# Patient Record
Sex: Female | Born: 1945 | Hispanic: No | Marital: Married | Smoking: Never smoker
Health system: Southern US, Community
[De-identification: ages and names within clinical notes are randomized; demographics above are authoritative.]

## PROBLEM LIST (undated history)

## (undated) DIAGNOSIS — F419 Anxiety disorder, unspecified: Secondary | ICD-10-CM

## (undated) DIAGNOSIS — M1990 Unspecified osteoarthritis, unspecified site: Secondary | ICD-10-CM

## (undated) DIAGNOSIS — G20 Parkinson's disease: Secondary | ICD-10-CM

## (undated) DIAGNOSIS — G20A1 Parkinson's disease without dyskinesia, without mention of fluctuations: Secondary | ICD-10-CM

## (undated) HISTORY — PX: EYE SURGERY: SHX253

## (undated) HISTORY — PX: TONSILLECTOMY: SUR1361

## (undated) HISTORY — PX: TUBAL LIGATION: SHX77

## (undated) HISTORY — PX: BREAST SURGERY: SHX581

---

## 2016-06-29 ENCOUNTER — Other Ambulatory Visit: Payer: Self-pay | Admitting: Neurological Surgery

## 2016-07-04 ENCOUNTER — Encounter (HOSPITAL_COMMUNITY): Payer: Self-pay | Admitting: *Deleted

## 2016-07-04 ENCOUNTER — Encounter (HOSPITAL_COMMUNITY)
Admission: RE | Admit: 2016-07-04 | Discharge: 2016-07-04 | Disposition: A | Discharge status: Home / Self Care | Payer: Medicare Other | Source: Ambulatory Visit | Attending: Neurological Surgery | Admitting: Neurological Surgery

## 2016-07-04 DIAGNOSIS — M48061 Spinal stenosis, lumbar region without neurogenic claudication: Secondary | ICD-10-CM | POA: Diagnosis not present

## 2016-07-04 DIAGNOSIS — G20 Parkinson's disease: Secondary | ICD-10-CM | POA: Diagnosis not present

## 2016-07-04 DIAGNOSIS — F419 Anxiety disorder, unspecified: Secondary | ICD-10-CM | POA: Diagnosis not present

## 2016-07-04 DIAGNOSIS — M4727 Other spondylosis with radiculopathy, lumbosacral region: Secondary | ICD-10-CM | POA: Diagnosis present

## 2016-07-04 HISTORY — DX: Unspecified osteoarthritis, unspecified site: M19.90

## 2016-07-04 HISTORY — DX: Anxiety disorder, unspecified: F41.9

## 2016-07-04 HISTORY — DX: Parkinson's disease: G20

## 2016-07-04 HISTORY — DX: Parkinson's disease without dyskinesia, without mention of fluctuations: G20.A1

## 2016-07-04 LAB — CBC
HEMATOCRIT: 34.8 % — AB (ref 36.0–46.0)
Hemoglobin: 11.3 g/dL — ABNORMAL LOW (ref 12.0–15.0)
MCH: 29 pg (ref 26.0–34.0)
MCHC: 32.5 g/dL (ref 30.0–36.0)
MCV: 89.5 fL (ref 78.0–100.0)
PLATELETS: 271 10*3/uL (ref 150–400)
RBC: 3.89 MIL/uL (ref 3.87–5.11)
RDW: 12.4 % (ref 11.5–15.5)
WBC: 8.6 10*3/uL (ref 4.0–10.5)

## 2016-07-04 LAB — BASIC METABOLIC PANEL
Anion gap: 10 (ref 5–15)
BUN: 6 mg/dL (ref 6–20)
CHLORIDE: 103 mmol/L (ref 101–111)
CO2: 27 mmol/L (ref 22–32)
CREATININE: 0.55 mg/dL (ref 0.44–1.00)
Calcium: 8.9 mg/dL (ref 8.9–10.3)
GFR calc non Af Amer: >60 mL/min (ref >60)
Glucose, Bld: 95 mg/dL (ref 65–99)
POTASSIUM: 3.6 mmol/L (ref 3.5–5.1)
Sodium: 140 mmol/L (ref 135–145)

## 2016-07-04 LAB — SURGICAL PCR SCREEN
MRSA, PCR: NEGATIVE
Staphylococcus aureus: NEGATIVE

## 2016-07-04 NOTE — Pre-Procedure Instructions (Signed)
Kimberly Hodge  07/04/2016      [id number] - [place], [place] - [id number] [facility] [id number] Phone: [phone] Fax: [phone]    Your procedure is scheduled on Nov 1.  [facility] at 630 A.M.  Call this number if you have problems the morning of surgery:  [phone]   Remember:  Do not eat food or drink liquids after midnight.  Take these medicines the morning of surgery with A SIP OF WATER Carbidopa-Levodopa (Sinemet IR), Citalopram (Celexa), gabapentin (Neurontin)  Stop taking aspirin, BC's, Goody's, Herbal medications, Fish Oil, Ibuprofen, Advil, Motrin, Melatonin, Meloxicam (mobic)   Do not wear jewelry, make-up or nail polish.  Do not wear lotions, powders, or perfumes, or deoderant.  Do not shave 48 hours prior to surgery.  Men may shave face and neck.  Do not bring valuables to the hospital.  [facility] is not responsible for any belongings or valuables.  Contacts, dentures or bridgework may not be worn into surgery.  Leave your suitcase in the car.  After surgery it may be brought to your room.  For patients admitted to the hospital, discharge time will be determined by your treatment team.  Patients discharged the day of surgery will not be allowed to drive home.    Special instructions:  [place] - Preparing for Surgery  Before surgery, you can play an important role.  Because skin is not sterile, your skin needs to be as free of germs as possible.  You can reduce the number of germs on you skin by washing with CHG (chlorahexidine gluconate) soap before surgery.  CHG is an antiseptic cleaner which kills germs and bonds with the skin to continue killing germs even after washing.  Please DO NOT use if you have an allergy to CHG or antibacterial soaps.  If your skin becomes reddened/irritated stop using the CHG and inform your nurse when you arrive at Short Stay.  Do not shave (including legs  and underarms) for at least 48 hours prior to the first CHG shower.  You may shave your face.  Please follow these instructions carefully:   1.  Shower with CHG Soap the night before surgery and the    morning of Surgery.  2.  If you choose to wash your hair, wash your hair first as usual with your   normal shampoo.  3.  After you shampoo, rinse your hair and body thoroughly to remove the  Shampoo.  4.  Use CHG as you would any other liquid soap.  You can apply chg directly to the skin and wash gently with scrungie or a clean washcloth.  5.  Apply the CHG Soap to your body ONLY FROM THE NECK DOWN.  Do not use on open wounds or open sores.  Avoid contact with your eyes,  ears, mouth and genitals (private parts).  Wash genitals (private parts)  with your normal soap.  6.  Wash thoroughly, paying special attention to the area where your surgery  will be performed.  7.  Thoroughly rinse your body with warm water from the neck down.  8.  DO NOT shower/wash with your normal soap after using and rinsing off  the CHG Soap.  9.  Pat yourself dry with a clean towel.            10.  Wear clean pajamas.  11.  Place clean sheets on your bed the night of your first shower and do not sleep with pets.  Day of Surgery  Do not apply any lotions/deoderants the morning of surgery.  Please wear clean clothes to the hospital/surgery center.     Please read over the following fact sheets that you were given. Pain Booklet, Coughing and Deep Breathing, MRSA Information and Surgical Site Infection Prevention

## 2016-07-04 NOTE — Progress Notes (Signed)
[facility] Denies ever seeing a cardilogist. Denies ever having a stress test, card cath, or echo.

## 2016-07-05 ENCOUNTER — Encounter (HOSPITAL_COMMUNITY): Payer: Self-pay | Admitting: Anesthesiology

## 2016-07-05 NOTE — Anesthesia Preprocedure Evaluation (Addendum)
Anesthesia Evaluation  Patient identified by MRN, date of birth, ID band Patient awake    Reviewed: Allergy & Precautions, NPO status , Patient's Chart, lab work & pertinent test results  Airway Mallampati: II       Dental no notable dental hx.    Pulmonary neg pulmonary ROS,    Pulmonary exam normal        Cardiovascular negative cardio ROS Normal cardiovascular exam     Neuro/Psych  Neuromuscular disease    GI/Hepatic negative GI ROS, Neg liver ROS,   Endo/Other    Renal/GU negative Renal ROS  negative genitourinary   Musculoskeletal   Abdominal Normal abdominal exam  (+)   Peds negative pediatric ROS (+)  Hematology negative hematology ROS (+)   Anesthesia Other Findings   Reproductive/Obstetrics negative OB ROS                            Anesthesia Physical Anesthesia Plan  ASA: II  Anesthesia Plan: General   Post-op Pain Management:    Induction: Intravenous  Airway Management Planned: Oral ETT  Additional Equipment:   Intra-op Plan:   Post-operative Plan: Extubation in OR  Informed Consent: I have reviewed the patients History and Physical, chart, labs and discussed the procedure including the risks, benefits and alternatives for the proposed anesthesia with the patient or authorized representative who has indicated his/her understanding and acceptance.   Dental advisory given  Plan Discussed with: CRNA and Surgeon  Anesthesia Plan Comments:        Anesthesia Quick Evaluation

## 2016-07-06 ENCOUNTER — Encounter (HOSPITAL_COMMUNITY): Payer: Self-pay | Admitting: Certified Registered Nurse Anesthetist

## 2016-07-06 ENCOUNTER — Ambulatory Visit (HOSPITAL_COMMUNITY): Payer: Medicare Other

## 2016-07-06 ENCOUNTER — Ambulatory Visit (HOSPITAL_COMMUNITY): Payer: Medicare Other | Admitting: Anesthesiology

## 2016-07-06 ENCOUNTER — Observation Stay (HOSPITAL_COMMUNITY)
Admission: RE | Admit: 2016-07-06 | Discharge: 2016-07-07 | Disposition: A | Discharge status: Home / Self Care | Payer: Medicare Other | Source: Ambulatory Visit | Attending: Neurological Surgery | Admitting: Neurological Surgery

## 2016-07-06 ENCOUNTER — Encounter (HOSPITAL_COMMUNITY)
Admission: RE | Disposition: A | Discharge status: Home / Self Care | Payer: Self-pay | Source: Ambulatory Visit | Attending: Neurological Surgery

## 2016-07-06 DIAGNOSIS — G20 Parkinson's disease: Secondary | ICD-10-CM | POA: Diagnosis not present

## 2016-07-06 DIAGNOSIS — M48061 Spinal stenosis, lumbar region without neurogenic claudication: Secondary | ICD-10-CM | POA: Diagnosis not present

## 2016-07-06 DIAGNOSIS — F419 Anxiety disorder, unspecified: Secondary | ICD-10-CM | POA: Insufficient documentation

## 2016-07-06 DIAGNOSIS — M4727 Other spondylosis with radiculopathy, lumbosacral region: Secondary | ICD-10-CM | POA: Diagnosis not present

## 2016-07-06 DIAGNOSIS — Z419 Encounter for procedure for purposes other than remedying health state, unspecified: Secondary | ICD-10-CM

## 2016-07-06 HISTORY — PX: LUMBAR LAMINECTOMY/DECOMPRESSION MICRODISCECTOMY: SHX5026

## 2016-07-06 SURGERY — LUMBAR LAMINECTOMY/DECOMPRESSION MICRODISCECTOMY 2 LEVELS
Anesthesia: General | Site: Spine Lumbar

## 2016-07-06 MED ORDER — CEFAZOLIN IN D5W 1 GM/50ML IV SOLN
1.0000 g | Freq: Three times a day (TID) | INTRAVENOUS | Status: AC
  Administered 2016-07-06 (×2): 1 g via INTRAVENOUS
  Filled 2016-07-06 (×2): qty 50

## 2016-07-06 MED ORDER — CELECOXIB 200 MG PO CAPS
200.0000 mg | ORAL_CAPSULE | Freq: Two times a day (BID) | ORAL | Status: DC
  Administered 2016-07-06 – 2016-07-07 (×2): 200 mg via ORAL
  Filled 2016-07-06 (×2): qty 1

## 2016-07-06 MED ORDER — MENTHOL 3 MG MT LOZG: 1.0000 | LOZENGE | OROMUCOSAL | Status: DC | PRN

## 2016-07-06 MED ORDER — THROMBIN 5000 UNITS EX SOLR
CUTANEOUS | Status: DC | PRN
  Administered 2016-07-06: 10000 [IU] via TOPICAL

## 2016-07-06 MED ORDER — LACTATED RINGERS IV SOLN
INTRAVENOUS | Status: DC | PRN
  Administered 2016-07-06 (×2): via INTRAVENOUS

## 2016-07-06 MED ORDER — 0.9 % SODIUM CHLORIDE (POUR BTL) OPTIME
TOPICAL | Status: DC | PRN
  Administered 2016-07-06: 1000 mL

## 2016-07-06 MED ORDER — FENTANYL CITRATE (PF) 100 MCG/2ML IJ SOLN
INTRAMUSCULAR | Status: DC | PRN
  Administered 2016-07-06: 50 ug via INTRAVENOUS
  Administered 2016-07-06: 150 ug via INTRAVENOUS

## 2016-07-06 MED ORDER — METHYLPREDNISOLONE ACETATE 80 MG/ML IJ SUSP
INTRAMUSCULAR | Status: AC
  Filled 2016-07-06: qty 1

## 2016-07-06 MED ORDER — CELECOXIB 200 MG PO CAPS
ORAL_CAPSULE | ORAL | Status: AC
  Filled 2016-07-06: qty 1

## 2016-07-06 MED ORDER — BUPIVACAINE-EPINEPHRINE (PF) 0.5% -1:200000 IJ SOLN
INTRAMUSCULAR | Status: AC
  Filled 2016-07-06: qty 30

## 2016-07-06 MED ORDER — SUCCINYLCHOLINE CHLORIDE 200 MG/10ML IV SOSY
PREFILLED_SYRINGE | INTRAVENOUS | Status: AC
  Filled 2016-07-06: qty 10

## 2016-07-06 MED ORDER — SODIUM CHLORIDE 0.9% FLUSH: 3.0000 mL | Freq: Two times a day (BID) | INTRAVENOUS | Status: DC

## 2016-07-06 MED ORDER — BUPIVACAINE HCL (PF) 0.25 % IJ SOLN
INTRAMUSCULAR | Status: DC | PRN
  Administered 2016-07-06: 1 mL

## 2016-07-06 MED ORDER — BUPIVACAINE-EPINEPHRINE (PF) 0.5% -1:200000 IJ SOLN
INTRAMUSCULAR | Status: DC | PRN
  Administered 2016-07-06: 10 mL via PERINEURAL

## 2016-07-06 MED ORDER — POTASSIUM CHLORIDE IN NACL 20-0.9 MEQ/L-% IV SOLN: 100.0000 mL/h | INTRAVENOUS | Status: DC

## 2016-07-06 MED ORDER — CARBIDOPA-LEVODOPA 25-100 MG PO TABS
1.0000 | ORAL_TABLET | Freq: Two times a day (BID) | ORAL | Status: DC
  Administered 2016-07-07: 1 via ORAL
  Filled 2016-07-06 (×2): qty 1

## 2016-07-06 MED ORDER — SODIUM CHLORIDE 0.9 % IJ SOLN
INTRAMUSCULAR | Status: AC
  Filled 2016-07-06: qty 20

## 2016-07-06 MED ORDER — SODIUM CHLORIDE 0.9 % IV SOLN: 250.0000 mL | INTRAVENOUS | Status: DC

## 2016-07-06 MED ORDER — PHENYLEPHRINE 40 MCG/ML (10ML) SYRINGE FOR IV PUSH (FOR BLOOD PRESSURE SUPPORT)
PREFILLED_SYRINGE | INTRAVENOUS | Status: AC
  Filled 2016-07-06: qty 10

## 2016-07-06 MED ORDER — METHOCARBAMOL 750 MG PO TABS
750.0000 mg | ORAL_TABLET | Freq: Four times a day (QID) | ORAL | Status: DC
  Administered 2016-07-06 – 2016-07-07 (×4): 750 mg via ORAL
  Filled 2016-07-06 (×4): qty 1

## 2016-07-06 MED ORDER — SUGAMMADEX SODIUM 200 MG/2ML IV SOLN
INTRAVENOUS | Status: DC | PRN
  Administered 2016-07-06: 130 mg via INTRAVENOUS

## 2016-07-06 MED ORDER — HEMOSTATIC AGENTS (NO CHARGE) OPTIME
TOPICAL | Status: DC | PRN
  Administered 2016-07-06: 1 via TOPICAL

## 2016-07-06 MED ORDER — KETOROLAC TROMETHAMINE 30 MG/ML IJ SOLN
INTRAMUSCULAR | Status: AC
  Filled 2016-07-06: qty 1

## 2016-07-06 MED ORDER — METHYLPREDNISOLONE ACETATE 80 MG/ML IJ SUSP
INTRAMUSCULAR | Status: DC | PRN
  Administered 2016-07-06: 80 mg via INTRAMUSCULAR

## 2016-07-06 MED ORDER — ROCURONIUM BROMIDE 10 MG/ML (PF) SYRINGE
PREFILLED_SYRINGE | INTRAVENOUS | Status: AC
  Filled 2016-07-06: qty 10

## 2016-07-06 MED ORDER — ALUM & MAG HYDROXIDE-SIMETH 200-200-20 MG/5ML PO SUSP: 30.0000 mL | Freq: Four times a day (QID) | ORAL | Status: DC | PRN

## 2016-07-06 MED ORDER — MELATONIN 3 MG PO TABS
10.0000 mg | ORAL_TABLET | Freq: Every evening | ORAL | Status: DC | PRN
  Filled 2016-07-06: qty 3.5

## 2016-07-06 MED ORDER — CHLORHEXIDINE GLUCONATE CLOTH 2 % EX PADS: 6.0000 | MEDICATED_PAD | Freq: Once | CUTANEOUS | Status: DC

## 2016-07-06 MED ORDER — BACITRACIN 50000 UNITS IM SOLR
INTRAMUSCULAR | Status: DC | PRN
  Administered 2016-07-06: 09:00:00

## 2016-07-06 MED ORDER — EPHEDRINE 5 MG/ML INJ
INTRAVENOUS | Status: AC
  Filled 2016-07-06: qty 10

## 2016-07-06 MED ORDER — DOCUSATE SODIUM 100 MG PO CAPS
100.0000 mg | ORAL_CAPSULE | Freq: Two times a day (BID) | ORAL | Status: DC
  Administered 2016-07-06 – 2016-07-07 (×2): 100 mg via ORAL
  Filled 2016-07-06 (×2): qty 1

## 2016-07-06 MED ORDER — SODIUM CHLORIDE 0.9 % IJ SOLN
INTRAMUSCULAR | Status: DC | PRN
  Administered 2016-07-06: 10 mL

## 2016-07-06 MED ORDER — PHENOL 1.4 % MT LIQD
1.0000 | OROMUCOSAL | Status: DC | PRN
Start: 2016-07-06 — End: 2016-07-07

## 2016-07-06 MED ORDER — EPHEDRINE SULFATE 50 MG/ML IJ SOLN
INTRAMUSCULAR | Status: DC | PRN
  Administered 2016-07-06: 10 mg via INTRAVENOUS

## 2016-07-06 MED ORDER — CARBIDOPA-LEVODOPA 25-100 MG PO TABS
0.5000 | ORAL_TABLET | Freq: Every day | ORAL | Status: DC
  Administered 2016-07-06: 0.5 via ORAL
  Filled 2016-07-06 (×2): qty 0.5

## 2016-07-06 MED ORDER — HYDROMORPHONE HCL 1 MG/ML IJ SOLN: 1.0000 mg | INTRAMUSCULAR | Status: DC | PRN

## 2016-07-06 MED ORDER — CELECOXIB 200 MG PO CAPS
200.0000 mg | ORAL_CAPSULE | Freq: Once | ORAL | Status: AC
  Administered 2016-07-06: 200 mg via ORAL

## 2016-07-06 MED ORDER — CITALOPRAM HYDROBROMIDE 40 MG PO TABS
40.0000 mg | ORAL_TABLET | Freq: Every day | ORAL | Status: DC
  Administered 2016-07-07: 40 mg via ORAL
  Filled 2016-07-06: qty 1

## 2016-07-06 MED ORDER — KETOROLAC TROMETHAMINE 30 MG/ML IJ SOLN
INTRAMUSCULAR | Status: DC | PRN
  Administered 2016-07-06: 1 mg via INTRAMUSCULAR

## 2016-07-06 MED ORDER — SODIUM CHLORIDE 0.9% FLUSH: 3.0000 mL | INTRAVENOUS | Status: DC | PRN

## 2016-07-06 MED ORDER — LIDOCAINE HCL (CARDIAC) 20 MG/ML IV SOLN
INTRAVENOUS | Status: DC | PRN
  Administered 2016-07-06: 100 mg via INTRAVENOUS

## 2016-07-06 MED ORDER — BUPIVACAINE LIPOSOME 1.3 % IJ SUSP
20.0000 mL | INTRAMUSCULAR | Status: AC
  Administered 2016-07-06: 20 mL
  Filled 2016-07-06: qty 20

## 2016-07-06 MED ORDER — ACETAMINOPHEN 500 MG PO TABS
1000.0000 mg | ORAL_TABLET | Freq: Four times a day (QID) | ORAL | Status: DC
  Administered 2016-07-06 – 2016-07-07 (×4): 1000 mg via ORAL
  Filled 2016-07-06 (×4): qty 2

## 2016-07-06 MED ORDER — LIDOCAINE-EPINEPHRINE 2 %-1:100000 IJ SOLN
INTRAMUSCULAR | Status: DC | PRN
  Administered 2016-07-06: 10 mL via INTRADERMAL

## 2016-07-06 MED ORDER — FENTANYL CITRATE (PF) 100 MCG/2ML IJ SOLN
INTRAMUSCULAR | Status: AC
  Filled 2016-07-06: qty 4

## 2016-07-06 MED ORDER — GABAPENTIN 300 MG PO CAPS
300.0000 mg | ORAL_CAPSULE | Freq: Three times a day (TID) | ORAL | Status: DC
  Administered 2016-07-06 – 2016-07-07 (×3): 300 mg via ORAL
  Filled 2016-07-06 (×3): qty 1

## 2016-07-06 MED ORDER — DEXAMETHASONE SODIUM PHOSPHATE 10 MG/ML IJ SOLN
INTRAMUSCULAR | Status: DC | PRN
  Administered 2016-07-06: 5 mg via INTRAVENOUS

## 2016-07-06 MED ORDER — ONDANSETRON HCL 4 MG/2ML IJ SOLN
4.0000 mg | INTRAMUSCULAR | Status: DC | PRN
Start: 2016-07-06 — End: 2016-07-07

## 2016-07-06 MED ORDER — CEFAZOLIN SODIUM-DEXTROSE 2-4 GM/100ML-% IV SOLN
2.0000 g | INTRAVENOUS | Status: AC
  Administered 2016-07-06: 2 g via INTRAVENOUS

## 2016-07-06 MED ORDER — ROCURONIUM BROMIDE 100 MG/10ML IV SOLN
INTRAVENOUS | Status: DC | PRN
  Administered 2016-07-06: 50 mg via INTRAVENOUS

## 2016-07-06 MED ORDER — ONDANSETRON HCL 4 MG/2ML IJ SOLN
INTRAMUSCULAR | Status: DC | PRN
  Administered 2016-07-06: 4 mg via INTRAVENOUS

## 2016-07-06 MED ORDER — LIDOCAINE-EPINEPHRINE 2 %-1:100000 IJ SOLN
INTRAMUSCULAR | Status: AC
  Filled 2016-07-06: qty 1

## 2016-07-06 MED ORDER — OXYCODONE HCL 5 MG PO TABS
5.0000 mg | ORAL_TABLET | ORAL | Status: DC | PRN
  Administered 2016-07-06: 10 mg via ORAL
  Filled 2016-07-06: qty 2

## 2016-07-06 MED ORDER — BUPIVACAINE HCL (PF) 0.25 % IJ SOLN
INTRAMUSCULAR | Status: AC
  Filled 2016-07-06: qty 30

## 2016-07-06 MED ORDER — PROPOFOL 10 MG/ML IV BOLUS
INTRAVENOUS | Status: AC
  Filled 2016-07-06: qty 20

## 2016-07-06 MED ORDER — CEFAZOLIN SODIUM-DEXTROSE 2-4 GM/100ML-% IV SOLN
INTRAVENOUS | Status: AC
  Filled 2016-07-06: qty 100

## 2016-07-06 MED ORDER — THROMBIN 5000 UNITS EX SOLR
CUTANEOUS | Status: AC
  Filled 2016-07-06: qty 15000

## 2016-07-06 MED ORDER — CARBIDOPA-LEVODOPA 25-100 MG PO TABS
2.5000 | ORAL_TABLET | Freq: Three times a day (TID) | ORAL | Status: DC
  Administered 2016-07-06: 1 via ORAL
  Filled 2016-07-06 (×2): qty 2.5

## 2016-07-06 MED ORDER — BISACODYL 10 MG RE SUPP: 10.0000 mg | Freq: Every day | RECTAL | Status: DC | PRN

## 2016-07-06 MED ORDER — LIDOCAINE 2% (20 MG/ML) 5 ML SYRINGE
INTRAMUSCULAR | Status: AC
  Filled 2016-07-06: qty 5

## 2016-07-06 MED ORDER — PROPOFOL 10 MG/ML IV BOLUS
INTRAVENOUS | Status: DC | PRN
  Administered 2016-07-06: 200 mg via INTRAVENOUS

## 2016-07-06 MED ORDER — THROMBIN 5000 UNITS EX SOLR
OROMUCOSAL | Status: DC | PRN
  Administered 2016-07-06: 09:00:00 via TOPICAL

## 2016-07-06 MED ORDER — FLEET ENEMA 7-19 GM/118ML RE ENEM: 1.0000 | ENEMA | Freq: Once | RECTAL | Status: DC | PRN

## 2016-07-06 MED ORDER — SENNA 8.6 MG PO TABS
1.0000 | ORAL_TABLET | Freq: Two times a day (BID) | ORAL | Status: DC
  Administered 2016-07-06 – 2016-07-07 (×2): 8.6 mg via ORAL
  Filled 2016-07-06 (×2): qty 1

## 2016-07-06 SURGICAL SUPPLY — 56 items
BAG DECANTER FOR FLEXI CONT (MISCELLANEOUS) ×2 IMPLANT
BENZOIN TINCTURE PRP APPL 2/3 (GAUZE/BANDAGES/DRESSINGS) IMPLANT
BIT DRILL NEURO 2X3.1 SFT TUCH (MISCELLANEOUS) ×1 IMPLANT
BLADE CLIPPER SURG (BLADE) IMPLANT
BLADE SURG 11 STRL SS (BLADE) ×2 IMPLANT
BUR ROUND FLUTED 5 RND (BURR) ×2 IMPLANT
CANISTER SUCT 3000ML PPV (MISCELLANEOUS) ×4 IMPLANT
CHLORAPREP W/TINT 26ML (MISCELLANEOUS) ×2 IMPLANT
CONT SPEC 4OZ CLIKSEAL STRL BL (MISCELLANEOUS) ×2 IMPLANT
DECANTER SPIKE VIAL GLASS SM (MISCELLANEOUS) ×2 IMPLANT
DERMABOND ADVANCED (GAUZE/BANDAGES/DRESSINGS) ×1
DERMABOND ADVANCED .7 DNX12 (GAUZE/BANDAGES/DRESSINGS) ×1 IMPLANT
DRAPE MICROSCOPE LEICA (MISCELLANEOUS) ×2 IMPLANT
DRAPE POUCH INSTRU U-SHP 10X18 (DRAPES) ×2 IMPLANT
DRAPE SURG 17X23 STRL (DRAPES) ×2 IMPLANT
DRILL NEURO 2X3.1 SOFT TOUCH (MISCELLANEOUS) ×2
DRSG OPSITE POSTOP 4X6 (GAUZE/BANDAGES/DRESSINGS) ×2 IMPLANT
ELECT REM PT RETURN 9FT ADLT (ELECTROSURGICAL) ×2
ELECTRODE REM PT RTRN 9FT ADLT (ELECTROSURGICAL) ×1 IMPLANT
GAUZE SPONGE 4X4 12PLY STRL (GAUZE/BANDAGES/DRESSINGS) IMPLANT
GAUZE SPONGE 4X4 16PLY XRAY LF (GAUZE/BANDAGES/DRESSINGS) IMPLANT
GLOVE BIOGEL PI IND STRL 7.5 (GLOVE) ×1 IMPLANT
GLOVE BIOGEL PI INDICATOR 7.5 (GLOVE) ×1
GLOVE SS BIOGEL STRL SZ 7.5 (GLOVE) ×2 IMPLANT
GLOVE SUPERSENSE BIOGEL SZ 7.5 (GLOVE) ×2
GOWN STRL REUS W/ TWL LRG LVL3 (GOWN DISPOSABLE) ×1 IMPLANT
GOWN STRL REUS W/ TWL XL LVL3 (GOWN DISPOSABLE) ×2 IMPLANT
GOWN STRL REUS W/TWL LRG LVL3 (GOWN DISPOSABLE) ×1
GOWN STRL REUS W/TWL XL LVL3 (GOWN DISPOSABLE) ×2
HEMOSTAT POWDER KIT SURGIFOAM (HEMOSTASIS) ×2 IMPLANT
KIT BASIN OR (CUSTOM PROCEDURE TRAY) ×2 IMPLANT
KIT ROOM TURNOVER OR (KITS) ×2 IMPLANT
NEEDLE HYPO 18GX1.5 BLUNT FILL (NEEDLE) ×2 IMPLANT
NEEDLE HYPO 21X1.5 SAFETY (NEEDLE) ×4 IMPLANT
NEEDLE HYPO 25X1 1.5 SAFETY (NEEDLE) ×4 IMPLANT
NS IRRIG 1000ML POUR BTL (IV SOLUTION) ×2 IMPLANT
PACK LAMINECTOMY NEURO (CUSTOM PROCEDURE TRAY) ×2 IMPLANT
PACK UNIVERSAL I (CUSTOM PROCEDURE TRAY) ×2 IMPLANT
PAD ARMBOARD 7.5X6 YLW CONV (MISCELLANEOUS) ×6 IMPLANT
PATTIES SURGICAL .5X1.5 (GAUZE/BANDAGES/DRESSINGS) ×2 IMPLANT
RUBBERBAND STERILE (MISCELLANEOUS) ×4 IMPLANT
SPONGE SURGIFOAM ABS GEL SZ50 (HEMOSTASIS) ×2 IMPLANT
STRIP CLOSURE SKIN 1/2X4 (GAUZE/BANDAGES/DRESSINGS) IMPLANT
SUT STRATAFIX MNCRL+ 3-0 PS-2 (SUTURE) ×1
SUT STRATAFIX MONOCRYL 3-0 (SUTURE) ×1
SUT VIC AB 0 CT1 18XCR BRD8 (SUTURE) ×2 IMPLANT
SUT VIC AB 0 CT1 8-18 (SUTURE) ×2
SUT VIC AB 2-0 CT1 18 (SUTURE) ×4 IMPLANT
SUT VIC AB 4-0 PS2 27 (SUTURE) ×2 IMPLANT
SUTURE STRATFX MNCRL+ 3-0 PS-2 (SUTURE) ×1 IMPLANT
SYR 30ML LL (SYRINGE) ×4 IMPLANT
SYR 5ML LL (SYRINGE) ×2 IMPLANT
TOWEL OR 17X24 6PK STRL BLUE (TOWEL DISPOSABLE) ×2 IMPLANT
TOWEL OR 17X26 10 PK STRL BLUE (TOWEL DISPOSABLE) ×2 IMPLANT
TUBE CONNECTING 12X1/4 (SUCTIONS) ×2 IMPLANT
WATER STERILE IRR 1000ML POUR (IV SOLUTION) ×2 IMPLANT

## 2016-07-06 NOTE — Transfer of Care (Signed)
Immediate Anesthesia Transfer of Care Note  Patient: Kimberly Hodge  Procedure(s) Performed: Procedure(s) with comments: LUMBAR THREE - LUMBAR FIVE LAMINECTOMY FOR DECOMPRESSION (N/A) - LUMBAR THREE - LUMBAR FIVE LAMINECTOMY FOR DECOMPRESSION  Patient Location: PACU  Anesthesia Type:General  Level of Consciousness: awake, alert , oriented and patient cooperative  Airway & Oxygen Therapy: Patient Spontanous Breathing and Patient connected to nasal cannula oxygen  Post-op Assessment: Report given to RN and Post -op Vital signs reviewed and stable  Post vital signs: Reviewed and stable  Last Vitals:  Vitals:   07/06/16 0701  BP: 129/69  Pulse: 86  Resp: 20  Temp: 36.5 C    Last Pain:  Vitals:   07/06/16 0722  TempSrc:   PainSc: 7       Patients Stated Pain Goal: 2 (07/06/16 16100722)  Complications: No apparent anesthesia complications

## 2016-07-06 NOTE — Evaluation (Signed)
Occupational Therapy Evaluation and Discharge Patient Details Name: Kimberly Hodge MRN: [id number] DOB: 08/24/1946 Today's Date: 07/06/2016    History of Present Illness Pt is a 70 y.o. female s/p L3-5 laminectomy for decompression. PMHx: Anxiety, Arthritis, Parkinson disease.   Clinical Impression   Pt reports she was independent with ADL PTA. Currently pt overall supervision for safety with ADL and functional mobility. All back, safety, and ADL education completed with pt and husband; they have no further questions or concerns for OT at this time. Pt planning to d/c home with 24/7 supervision from her husband. No further acute OT needs identified; signing off at this time. Please re-consult if needs change. Thank you for this referral.    Follow Up Recommendations  No OT follow up;Supervision/Assistance - 24 hour    Equipment Recommendations  None recommended by OT    Recommendations for Other Services PT consult     Precautions / Restrictions Precautions Precautions: Back Precaution Booklet Issued: Yes (comment) Precaution Comments: Educated pt and husband on back precautions. Restrictions Weight Bearing Restrictions: No      Mobility Bed Mobility Overal bed mobility: Needs Assistance Bed Mobility: Rolling;Sidelying to Sit;Sit to Sidelying Rolling: Supervision Sidelying to sit: Supervision     Sit to sidelying: Min guard General bed mobility comments: HOB flat with use of bed rail for sidelying to sit. VCs for log roll technique.  Transfers Overall transfer level: Needs assistance Equipment used: Rolling walker (2 wheeled) Transfers: Sit to/from Stand Sit to Stand: Supervision         General transfer comment: Supervision for safety. VCs for hand placement.    Balance Overall balance assessment: Needs assistance Sitting-balance support: Feet supported;No upper extremity supported Sitting balance-Leahy Scale: Good     Standing balance support: No  upper extremity supported;During functional activity Standing balance-Leahy Scale: Fair Standing balance comment: Able to stand at sink and wash hands without UE support                            ADL Overall ADL's : Needs assistance/impaired Eating/Feeding: Modified independent;Sitting   Grooming: Supervision/safety;Standing;Wash/dry hands Grooming Details (indicate cue type and reason): Educated on use of 2 cups for oral care Upper Body Bathing: Supervision/ safety;Set up;Sitting   Lower Body Bathing: Supervison/ safety;Sit to/from stand   Upper Body Dressing : Set up;Supervision/safety;Sitting Upper Body Dressing Details (indicate cue type and reason): to don hospital gown Lower Body Dressing: Supervision/safety;Sit to/from stand Lower Body Dressing Details (indicate cue type and reason): Pt able to cross foot over opposite knee. Educated on compensatory strategies for LB ADL. Toilet Transfer: Supervision/safety;Ambulation;BSC;RW   Toileting- Clothing Manipulation and Hygiene: Supervision/safety;Sit to/from stand Toileting - Clothing Manipulation Details (indicate cue type and reason): Pt able to demo good peri care technique without twisting. Tub/ Shower Transfer: Supervision/safety;Walk-in shower;Ambulation;Shower Dealerseat;Rolling walker Tub/Shower Transfer Details (indicate cue type and reason): Educated on use of shower chair and supervision for safety with transfers initially Functional mobility during ADLs: Supervision/safety;Rolling walker General ADL Comments: Educated pt and husband on maintaining back precautions during functional activities, keeping frequently used items at counter top height, log roll technique for bed mobility, frequent mobility throughout the day.     Vision Vision Assessment?: No apparent visual deficits   Perception     Praxis      Pertinent Vitals/Pain Pain Assessment: Faces Faces Pain Scale: Hurts even more Pain Location: back, L  leg Pain Descriptors / Indicators: Aching;Sore Pain  Intervention(s): Monitored during session     Hand Dominance     Extremity/Trunk Assessment Upper Extremity Assessment Upper Extremity Assessment: Overall WFL for tasks assessed   Lower Extremity Assessment Lower Extremity Assessment: Defer to PT evaluation   Cervical / Trunk Assessment Cervical / Trunk Assessment: Other exceptions Cervical / Trunk Exceptions: s/p spinal sx   Communication Communication Communication: No difficulties   Cognition Arousal/Alertness: Awake/alert Behavior During Therapy: WFL for tasks assessed/performed Overall Cognitive Status: Within Functional Limits for tasks assessed                     General Comments       Exercises       Shoulder Instructions      Home Living Family/patient expects to be discharged to:: Private residence Living Arrangements: Spouse/significant other Available Help at Discharge: Family;Available 24 hours/day Type of Home: House Home Access: Elevator     Home Layout: Two level;Bed/bath upstairs;Able to live on main level with bedroom/bathroom (handicapped accessible in law suite downstairs)     Bathroom Shower/Tub: Arts development officerWalk-in shower   Bathroom Toilet: Handicapped height     Home Equipment: Environmental consultantWalker - 4 wheels;Shower seat;Grab bars - toilet;Grab bars - tub/shower;Hand held shower head          Prior Functioning/Environment Level of Independence: Independent with assistive device(s)        Comments: rollator for mobility        OT Problem List:     OT Treatment/Interventions:      OT Goals(Current goals can be found in the care plan section) Acute Rehab OT Goals Patient Stated Goal: return home OT Goal Formulation: All assessment and education complete, DC therapy  OT Frequency:     Barriers to D/C:            Co-evaluation              End of Session Equipment Utilized During Treatment: Rolling walker Nurse Communication:  Mobility status;Other (comment) (no equipment or f/u needs)  Activity Tolerance: Patient tolerated treatment well Patient left: in bed;with call bell/phone within reach;with family/visitor present   Time: [id number] OT Time Calculation (min): 32 min Charges:  OT General Charges $OT Visit: 1 Procedure OT Evaluation $OT Eval Moderate Complexity: 1 Procedure OT Treatments $Self Care/Home Management : 8-22 mins G-Codes: OT G-codes **NOT FOR INPATIENT CLASS** Functional Assessment Tool Used: Clinical judgement Functional Limitation: Self care Self Care Current Status ([id number]): At least 1 percent but less than 20 percent impaired, limited or restricted Self Care Goal Status ([id number]([id number]): At least 1 percent but less than 20 percent impaired, limited or restricted Self Care Discharge Status [phone]([id number]): At least 1 percent but less than 20 percent impaired, limited or restricted   Gaye AlkenBailey A Kirstin Kugler M.S., OTR/L Pager: [phone]  07/06/2016, 5:16 PM

## 2016-07-06 NOTE — Anesthesia Procedure Notes (Signed)
Procedure Name: Intubation Date/Time: 07/06/2016 8:39 AM Performed by: Faustino CongressWHITE, Rayford Williamsen TENA Husna Krone Pre-anesthesia Checklist: Patient identified, Emergency Drugs available, Suction available and Patient being monitored Patient Re-evaluated:Patient Re-evaluated prior to inductionOxygen Delivery Method: Circle System Utilized Preoxygenation: Pre-oxygenation with 100% oxygen Intubation Type: IV induction Ventilation: Mask ventilation without difficulty Laryngoscope Size: Mac and 3 Grade View: Grade I Tube type: Oral Tube size: 7.0 mm Number of attempts: 1 Airway Equipment and Method: Stylet Placement Confirmation: ETT inserted through vocal cords under direct vision,  positive ETCO2 and breath sounds checked- equal and bilateral Secured at: 21 cm Tube secured with: Tape Dental Injury: Teeth and Oropharynx as per pre-operative assessment

## 2016-07-06 NOTE — Op Note (Signed)
07/06/2016  9:51 AM  PATIENT:  Kimberly Hodge  70 y.o. 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 Ditty, MD  ASSISTANTS: None  ANESTHESIA:   General  DRAINS: None   SPECIMEN:  None  INDICATION FOR PROCEDURE: 70 year old woman with neurogenic claudication refractory to medical management. Patient understood the risks, benefits, and alternatives and potential outcomes and wished to proceed.  PROCEDURE DETAILS: After smooth induction of general endotracheal anesthesia the patient was turned prone on the operating table on a Wilson frame. The skin of the lumbar region was clipped of hair. It was wiped down with alcohol. The patient was then prepped and draped in usual sterile fashion.   The subcutaneous tissues of the midline from approximately L3 to L5 was infiltrated with Marcaine. The skin in this area was opened sharply. The soft tissues were dissected with monopolar cautery. Subperiosteal dissection was carried out along the sides of the spinous processes and the laminar surfaces to the medial edge of the facet joints from L3 to L5. The spinous processes were removed with rongeurs. The laminae were then thinned with a high-speed bur. The remaining lamina was resected with a Kerrison punch. Thickened ligamentum flavum was separated from the dura and resected with a Kerrison rongeur.   The operating microscope was then draped and brought into the field to provide light and magnification.  Using microsurgical technique the thecal sac was further freed from the hypertrophied ligament in the lateral recesses.  Lateral recess decompression was completed. Decompression was carried out laterally to the foramina. The foramina were palpated and found to be without residual  stenosis.   I irrigated vigorously with bacitracin saline. There was excellent hemostasis. I injected a mixture of toradol, marcaine, and medrol in the epidural space.  The wound was then closed in routine anatomic layers with interrupted vicryl sutures. I injected exparel in the paraspinous muscles. The skin was closed with a running Monocryl stratafix suture. It was then sealed with Dermabond. The patient was turned to the supine position and allowed to awaken from anesthesia.  PATIENT DISPOSITION:  PACU - hemodynamically stable.   Delay start of Pharmacological VTE agent (>24hrs) due to surgical blood loss or risk of bleeding:  yes

## 2016-07-06 NOTE — H&P (Signed)
CC:  No chief complaint on file.   HPI: Kimberly Hodge is a 70 y.o. female with medically intractable low back pain radiating into her legs.  Symptoms are unchanged since clinic.  She presents for elective lumbar decompression.  PMH: Past Medical History:  Diagnosis Date  . Anxiety   . Arthritis   . Parkinson disease (HCC)     PSH: Past Surgical History:  Procedure Laterality Date  . BREAST SURGERY    . EYE SURGERY Bilateral   . TONSILLECTOMY    . TUBAL LIGATION      SH: Social History  Substance Use Topics  . Smoking status: Never Smoker  . Smokeless tobacco: Never Used  . Alcohol use Yes     Comment: occ    MEDS: Prior to Admission medications   Medication Sig Start Date End Date Taking? Authorizing Provider  Calcium Carbonate-Vitamin D (CALCIUM 600+D PO) Take 1 tablet by mouth daily.   Yes Historical Provider, MD  carbidopa-levodopa (SINEMET IR) 25-100 MG tablet Take 2.5 tablets by mouth 3 (three) times daily.   Yes Historical Provider, MD  citalopram (CELEXA) 40 MG tablet Take 40 mg by mouth daily.   Yes Historical Provider, MD  gabapentin (NEURONTIN) 300 MG capsule Take 300 mg by mouth 3 (three) times daily.   Yes Historical Provider, MD  ibuprofen (ADVIL,MOTRIN) 200 MG tablet Take 400 mg by mouth every 6 (six) hours as needed for headache or moderate pain.   Yes Historical Provider, MD  Melatonin 5 MG CHEW Chew 10 mg by mouth at bedtime as needed (sleep).   Yes Historical Provider, MD  meloxicam (MOBIC) 15 MG tablet Take 15 mg by mouth every evening.   Yes Historical Provider, MD    ALLERGY: Allergies  Allergen Reactions  . No Known Allergies     ROS: ROS  NEUROLOGIC EXAM: Awake, alert, oriented Memory and concentration grossly intact Speech fluent, appropriate CN grossly intact Motor exam: Upper Extremities Deltoid Bicep Tricep Grip  Right 5/5 5/5 5/5 5/5  Left 5/5 5/5 5/5 5/5   Lower Extremity IP Quad PF DF EHL  Right 5/5 5/5 5/5 5/5 5/5   Left 5/5 5/5 5/5 5/5 5/5   Sensation grossly intact to LT  IMAGING: No new imaging  IMPRESSION: - 70 y.o. female with lumbosacral spondylosis with radiculopathy and neurogenic claudication.  No focal deficits.  PLAN: - L3-5 laminectomy for decompression - All questions answered - Ready for OR

## 2016-07-06 NOTE — Anesthesia Postprocedure Evaluation (Signed)
Anesthesia Post Note  Patient: Kimberly Hodge  Procedure(s) Performed: Procedure(s) (LRB): LUMBAR THREE - LUMBAR FIVE LAMINECTOMY FOR DECOMPRESSION (N/A)  Patient location during evaluation: PACU Anesthesia Type: General Level of consciousness: awake and sedated Pain management: pain level controlled Vital Signs Assessment: post-procedure vital signs reviewed and stable Respiratory status: spontaneous breathing Cardiovascular status: stable Postop Assessment: no signs of nausea or vomiting Anesthetic complications: no     Last Vitals:  Vitals:   07/06/16 1038 07/06/16 1040  BP: 110/61   Pulse:  88  Resp:  (!) 21  Temp:      Last Pain:  Vitals:   07/06/16 1040  TempSrc:   PainSc: 0-No pain   Pain Goal: Patients Stated Pain Goal: 2 (07/06/16 0722)               Hal Norrington JR,JOHN Susann GivensFRANKLIN

## 2016-07-07 ENCOUNTER — Encounter (HOSPITAL_COMMUNITY): Payer: Self-pay | Admitting: Neurological Surgery

## 2016-07-07 DIAGNOSIS — M4727 Other spondylosis with radiculopathy, lumbosacral region: Secondary | ICD-10-CM | POA: Diagnosis not present

## 2016-07-07 MED ORDER — HYDROCODONE-ACETAMINOPHEN 7.5-325 MG PO TABS: 1.0000 | ORAL_TABLET | Freq: Four times a day (QID) | ORAL | 0 refills | Status: AC | PRN

## 2016-07-07 MED ORDER — METHOCARBAMOL 750 MG PO TABS: 750.0000 mg | ORAL_TABLET | Freq: Four times a day (QID) | ORAL | 2 refills | Status: AC | PRN

## 2016-07-07 NOTE — Evaluation (Signed)
Physical Therapy Evaluation Patient Details Name: Kimberly Hodge MRN: [id number] DOB: 12/24/1945 Today's Date: 07/07/2016   History of Present Illness  Pt is a 70 y.o. female s/p L3-5 laminectomy for decompression. PMHx: Anxiety, Arthritis, Parkinson disease.  Clinical Impression  This patient presents with acute pain and decreased functional independence following the above mentioned procedure. At the time of PT eval, pt was able to perform transfers and ambulation with up to min assist for balance support and safety. Pt with a recent diagnosis of Parkinson's Disease and is beginning to see gait changes. Strongly feel this pt could benefit from follow up therapy at the outpatient level when appropriate per post-op protocol. This patient is appropriate for skilled PT interventions to address functional limitations, improve safety and independence with functional mobility, and return to PLOF.     Follow Up Recommendations Outpatient PT;Supervision for mobility/OOB    Equipment Recommendations  None recommended by PT    Recommendations for Other Services       Precautions / Restrictions Precautions Precautions: Back;Fall Precaution Booklet Issued: Yes (comment) Precaution Comments: Pt with recent diagnosis of Parkinson's and is unsteady.  Restrictions Weight Bearing Restrictions: No      Mobility  Bed Mobility Overal bed mobility: Needs Assistance Bed Mobility: Rolling;Sidelying to Sit Rolling: Modified independent (Device/Increase time) Sidelying to sit: Supervision       General bed mobility comments: HOB flat with use of bed rail for sidelying to sit. VCs for log roll technique.  Transfers Overall transfer level: Needs assistance Equipment used: Rolling walker (2 wheeled) Transfers: Sit to/from Stand Sit to Stand: Supervision         General transfer comment: Supervision for safety. VCs for hand placement.  Ambulation/Gait Ambulation/Gait assistance: Min  guard Ambulation Distance (Feet): 175 Feet Assistive device: Rolling walker (2 wheeled) Gait Pattern/deviations: Step-through pattern;Decreased stride length;Trunk flexed Gait velocity: Decreased Gait velocity interpretation: Below normal speed for age/gender General Gait Details: VC's for increased step clearance on the L.  Stairs Stairs: Yes Stairs assistance: Min assist Stair Management: One rail Right;Step to pattern;Forwards Number of Stairs: 10 General stair comments: HHA on the L side. Min assist occasionally for balance support.   Wheelchair Mobility    Modified Rankin (Stroke Patients Only)       Balance Overall balance assessment: Needs assistance Sitting-balance support: Feet supported;No upper extremity supported Sitting balance-Leahy Scale: Good     Standing balance support: No upper extremity supported;During functional activity Standing balance-Leahy Scale: Fair                               Pertinent Vitals/Pain Pain Assessment: Faces Faces Pain Scale: Hurts even more Pain Location: LLE Pain Descriptors / Indicators: Aching;Operative site guarding Pain Intervention(s): Limited activity within patient's tolerance;Monitored during session;Repositioned    Home Living Family/patient expects to be discharged to:: Private residence Living Arrangements: Spouse/significant other Available Help at Discharge: Family;Available 24 hours/day Type of Home: House Home Access: Elevator     Home Layout: Two level;Bed/bath upstairs;Able to live on main level with bedroom/bathroom (handicapped accessible in law suite downstairs) Home Equipment: Walker - 4 wheels;Shower seat;Grab bars - toilet;Grab bars - tub/shower;Hand held shower head      Prior Function Level of Independence: Independent with assistive device(s)         Comments: rollator for mobility     Hand Dominance        Extremity/Trunk Assessment   Upper Extremity  Assessment: Defer  to OT evaluation           Lower Extremity Assessment: Generalized weakness      Cervical / Trunk Assessment: Other exceptions  Communication   Communication: No difficulties  Cognition Arousal/Alertness: Awake/alert Behavior During Therapy: WFL for tasks assessed/performed Overall Cognitive Status: Within Functional Limits for tasks assessed                      General Comments      Exercises     Assessment/Plan    PT Assessment Patient needs continued PT services  PT Problem List Decreased range of motion;Decreased strength;Decreased activity tolerance;Decreased balance;Decreased mobility;Decreased knowledge of use of DME;Decreased safety awareness;Decreased knowledge of precautions;Pain          PT Treatment Interventions Gait training;DME instruction;Stair training;Functional mobility training;Therapeutic activities;Therapeutic exercise;Neuromuscular re-education;Patient/family education    PT Goals (Current goals can be found in the Care Plan section)  Acute Rehab PT Goals Patient Stated Goal: return home PT Goal Formulation: With patient/family Time For Goal Achievement: 07/14/16 Potential to Achieve Goals: Good    Frequency Min 5X/week   Barriers to discharge        Co-evaluation               End of Session Equipment Utilized During Treatment: Gait belt Activity Tolerance: Patient tolerated treatment well Patient left: in chair;with call bell/phone within reach;with family/visitor present Nurse Communication: Mobility status    Functional Assessment Tool Used: Clinical judgement Functional Limitation: Mobility: Walking and moving around Mobility: Walking and Moving Around Current Status [phone]): At least 20 percent but less than 40 percent impaired, limited or restricted Mobility: Walking and Moving Around Goal Status [phone]): At least 1 percent but less than 20 percent impaired, limited or restricted    Time: 0810-0857 PT Time  Calculation (min) (ACUTE ONLY): 47 min   Charges:   PT Evaluation $PT Eval Moderate Complexity: 1 Procedure PT Treatments $Gait Training: 8-22 mins $Therapeutic Activity: 8-22 mins   PT G Codes:   PT G-Codes **NOT FOR INPATIENT CLASS** Functional Assessment Tool Used: Clinical judgement Functional Limitation: Mobility: Walking and moving around Mobility: Walking and Moving Around Current Status ([phone]): At least 20 percent but less than 40 percent impaired, limited or restricted Mobility: Walking and Moving Around Goal Status [phone]([id number]): At least 1 percent but less than 20 percent impaired, limited or restricted    Conni SlipperKirkman, Janira Mandell 07/07/2016, 10:06 AM   Conni SlipperLaura Brayson Livesey, PT, DPT Acute Rehabilitation Services Pager: [phone]

## 2016-07-07 NOTE — Progress Notes (Signed)
Pt doing well. Pt and husband given D/C instructions with Rx's, verbal understanding was provided. Pt's incision is clean and dry with no sign of infection. Pt D/C'd home via wheelchair @ 1040 per MD order. Pt is stable @ D/C and has no other needs at this time. Rema FendtAshley Baily Hovanec, RN

## 2016-07-07 NOTE — Discharge Summary (Signed)
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  They were discharged in stable condition.  Follow up: 3 weeks    Medication List    TAKE these medications   CALCIUM 600+D PO Take 1 tablet by mouth daily.   carbidopa-levodopa 25-100 MG tablet Commonly known as:  SINEMET IR Take 0.5-1 tablets by mouth 3 (three) times daily with meals. Takes 1 whole tablet at breakfast and lunch, half tablet at dinner.   citalopram 40 MG tablet Commonly known as:  CELEXA Take 40 mg by mouth daily.   gabapentin 300 MG capsule Commonly known as:  NEURONTIN Take 300 mg by mouth 3 (three) times daily.   HYDROcodone-acetaminophen 7.5-325 MG tablet Commonly known as:  NORCO Take 1-2 tablets by mouth every 6 (six) hours as needed for moderate pain.   ibuprofen 200 MG tablet Commonly known as:  ADVIL,MOTRIN Take 400 mg by mouth every 6 (six) hours as needed for headache or moderate pain.   Melatonin 5 MG Chew Chew 10 mg by mouth at bedtime as needed (sleep).   meloxicam 15 MG tablet Commonly known as:  MOBIC Take 15 mg by mouth every evening.   methocarbamol 750 MG tablet Commonly known as:  ROBAXIN Take 1 tablet (750 mg total) by mouth every 6 (six) hours as needed for muscle spasms.

## 2016-07-07 NOTE — Progress Notes (Signed)
No acute events AVSS Awake and alert Moving legs well Stable and ready for d/c

## 2017-04-24 IMAGING — CR DG LUMBAR SPINE 1V
1 series · 1 of 1 positions shown · non-contrast
Comparison: MRI 02/23/2016.

CLINICAL DATA: Laminectomy.

EXAM:
LUMBAR SPINE - 1 VIEW

[lateral]
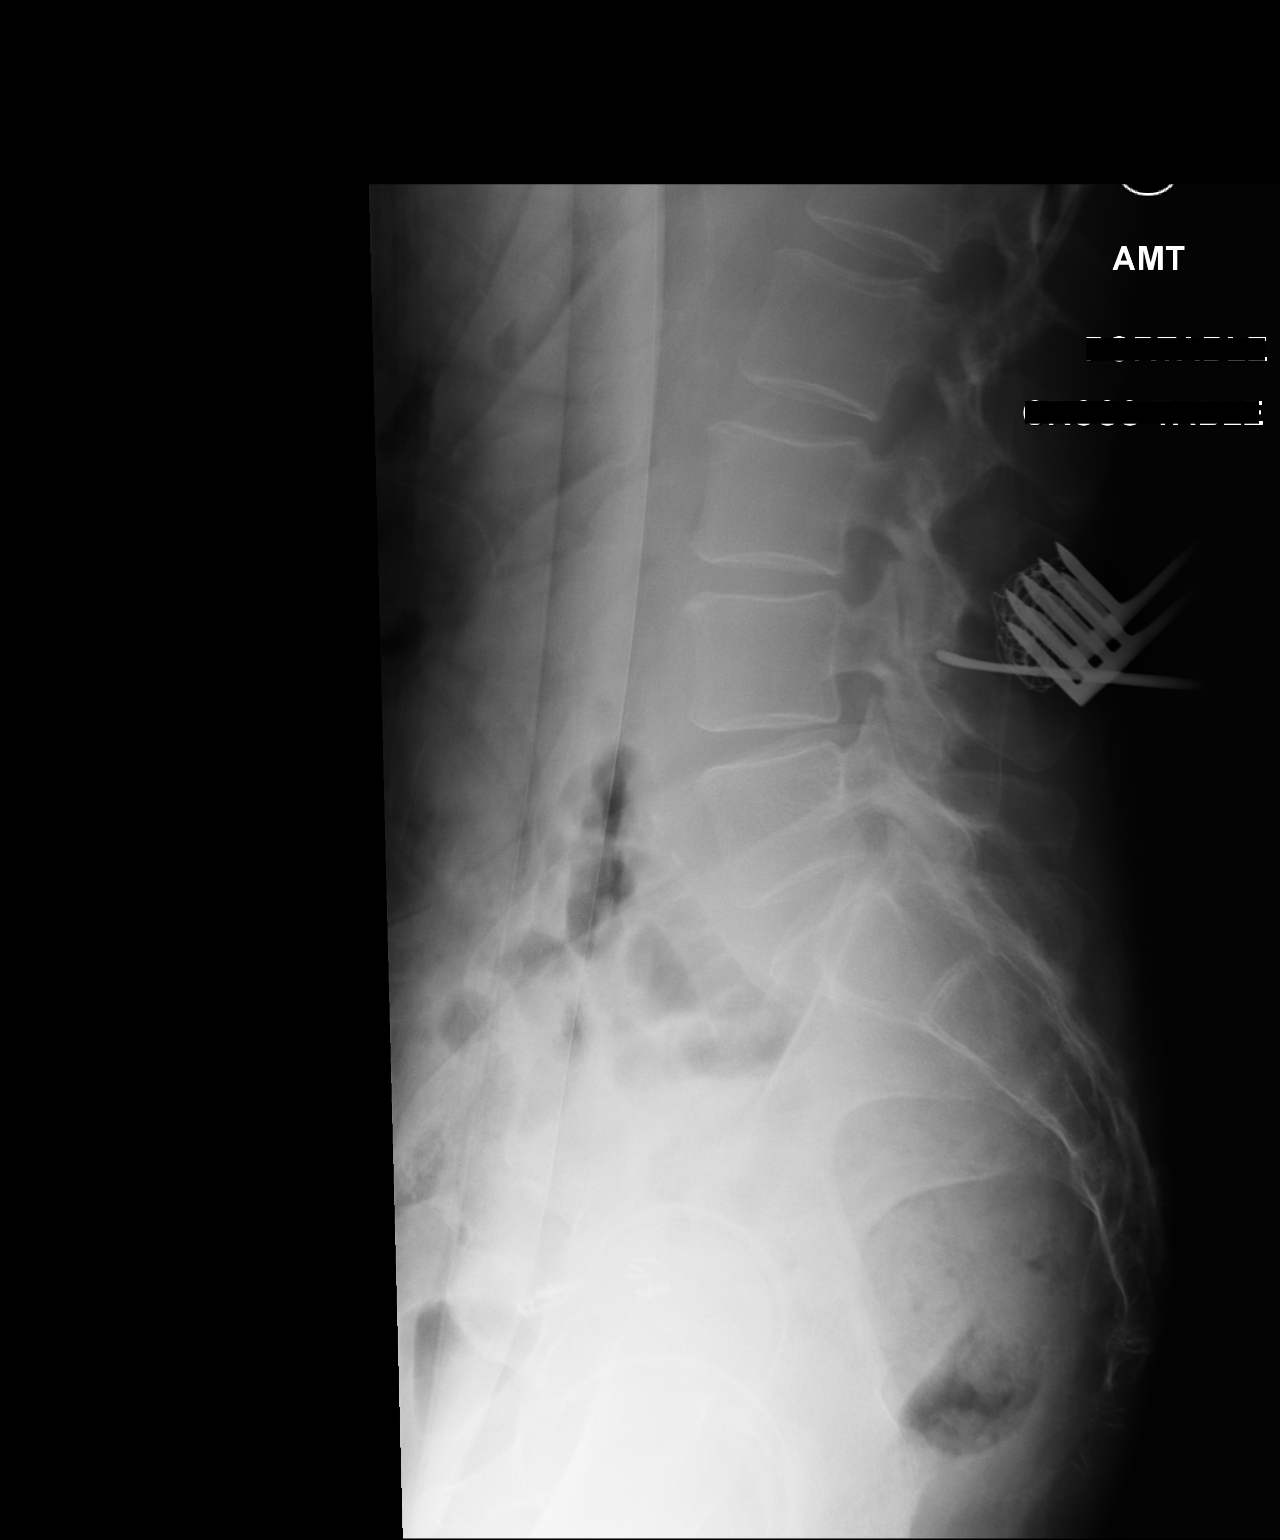

[1 of 1 positions shown; findings below may reference images not displayed]

FINDINGS: Lumbar vertebra numbered with lowest lumbar shaped segmented
appearing vertebral lateral view as L5. Metallic marker noted
posteriorly at L4. Surgical sponge noted posteriorly.
IMPRESSION: Metallic marker noted posteriorly at the level of L4.
